# Patient Record
Sex: Male | Born: 1960 | Race: White | Hispanic: No | Marital: Married | Smoking: Former smoker
Health system: Southern US, Community
[De-identification: ages and names within clinical notes are randomized; demographics above are authoritative.]

## PROBLEM LIST (undated history)

## (undated) DIAGNOSIS — K219 Gastro-esophageal reflux disease without esophagitis: Secondary | ICD-10-CM

## (undated) HISTORY — PX: NASAL SEPTUM SURGERY: SHX37

---

## 2013-03-05 ENCOUNTER — Encounter (HOSPITAL_COMMUNITY): Payer: Self-pay | Admitting: Pharmacy Technician

## 2013-03-05 ENCOUNTER — Other Ambulatory Visit (HOSPITAL_COMMUNITY): Payer: Self-pay | Admitting: Orthopedic Surgery

## 2013-03-05 NOTE — Patient Instructions (Signed)
20 Jeremiah Banks  03/05/2013   Your procedure is scheduled on:  03/07/13  THURSDAY  Report to [facility] at      1:30 PM  Call this number if you have problems the morning of surgery: [phone]       Remember:   Do not eat food:After Midnight. TONIGHT--- MAY HAVE CLEAR LIQUIDS Thursday MORNING UNTIL 0930am-  THEN NOTHING BY MOUTH   Take these medicines the morning of surgery with A SIP OF WATER:  May take hydromorphone if needed   .  Contacts, dentures or partial plates can not be worn to surgery  Leave suitcase in the car. After surgery it may be brought to your room.  For patients admitted to the hospital, checkout time is 11:00 AM day of  discharge.             SPECIAL INSTRUCTIONS- SEE [place] PREPARING FOR SURGERY INSTRUCTION SHEET-     DO NOT WEAR JEWELRY, LOTIONS, POWDERS, OR PERFUMES.  WOMEN-- DO NOT SHAVE LEGS OR UNDERARMS FOR 12 HOURS BEFORE SHOWERS. MEN MAY SHAVE FACE.  Patients discharged the day of surgery will not be allowed to drive home. IF going home the day of surgery, you must have a driver and someone to stay with you for the first 24 hours  Name and phone number of your driver:    Overnight stay                                                                    Please read over the following fact sheets that you were given: MRSA Information, Incentive Spirometry Sheet                                                                                  Jeremiah Banks  PST 336  [phone]                 FAILURE TO FOLLOW THESE INSTRUCTIONS MAY RESULT IN  CANCELLATION   OF YOUR SURGERY                                                  Patient Signature _____________________________

## 2013-03-05 NOTE — H&P (Signed)
Jeremiah Banks is an 52 y.o. male.   Chief Complaint: low back pain HPI: Jeremiah Banks presented to Dr. Grant Banks office with the chief complaint of low back pain. He has a two month history of pain in his back radiating into the right lower limb. He did have significantly more pain over the month. He was seen at [facility] and was given oral prednisone. It worked very well for the pain while he was on it, but the pain just came back. He states he has had issues like this in the past, but certainly not as bad as this. They usually get better on its own. He has never had back surgery. He really denies any significant weakness. He denies any bowel or bladder incontinence. No fevers, chills or night sweats. No recent trauma. 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 He notes he can not stand for a long period of time. Symptoms are much worse in the morning. MRI showed lumbar disc herniation at L4-L5 on the right.   Allergies: No Known Allergies  Family History Diabetes Mellitus. Maternal Grandfather, Mother. First Degree Relatives. reported Heart Disease. Maternal Grandfather. Heart disease in male family member before age 38 Heart disease in male family member before age 101  Social History Children. 1 Current work status. working full time Exercise. Exercises daily; does running / walking and gym / weights Living situation. live with spouse Marital status. married Never consumed alcohol. Never consumed alcohol No history of drug/alcohol rehab Not under pain contract Tobacco use. Former smoker.  smoke(d) 1 pack(s) per day  Past Surgical History Straighten Nasal Septum  Past Medical History Gastroesophageal Reflux Disease  Current outpatient prescriptions: HYDROmorphone (DILAUDID) 4 MG tablet, Take 4 mg by mouth every 4 (four) hours as needed for moderate pain or severe pain., Disp: , Rfl:   Review of Systems  Constitutional: Negative.   HENT: Negative.   Eyes:  Negative.   Respiratory: Negative.   Cardiovascular: Negative.   Gastrointestinal: Negative.   Genitourinary: Negative.   Musculoskeletal: Positive for back pain. Negative for falls, joint pain, myalgias and neck pain.  Skin: Negative.   Neurological: Negative.   Endo/Heme/Allergies: Negative.   Psychiatric/Behavioral: Negative.    Vitals Weight: 170 lb Height: 69.5 in Body Surface Area: 1.94 m Body Mass Index: 24.74 kg/m Pain Level: 9/10 BP: 123/74 (Sitting, Left Arm, Standard)  Physical Exam  Constitutional: He is oriented to person, place, and time. He appears well-developed and well-nourished. No distress.  HENT:  Head: Normocephalic and atraumatic.  Right Ear: External ear normal.  Left Ear: External ear normal.  Nose: Nose normal.  Mouth/Throat: Oropharynx is clear and moist.  Eyes: Conjunctivae and EOM are normal.  Neck: Normal range of motion. Neck supple.  Cardiovascular: Normal rate, regular rhythm, normal heart sounds and intact distal pulses.   No murmur heard. Respiratory: Effort normal and breath sounds normal. No respiratory distress. He has no wheezes.  GI: Soft. Bowel sounds are normal. He exhibits no distension. There is no tenderness.  Musculoskeletal:  He does have a positive straight leg raise on the right at about 30 degrees. This causes recurrent pain in the back, buttock and thigh. Negative cross straight leg raise on the left. He has excellent reflexes at the knees as well as the ankles. He may have a little bit of weakness of his right EHL muscle. He is able to walk on his heels without an obvious foot drop or foot slap.  Neurological:  He is alert and oriented to person, place, and time. He has normal strength and normal reflexes. No sensory deficit.  Skin: No rash noted. He is not diaphoretic. No erythema.  Psychiatric: He has a normal mood and affect. His behavior is normal.     Assessment/Plan Lumbar disc herniation L4-L5 right He  needs a lumbar hemilaminectomy and microdiscectomy L4-L5 on the right.  The possible complications of spinal surgery number one could be infection, which is extremely rare. We do use antibiotics prior to the surgery and during surgery and after surgery. Number two is always a slight degree of probability that you could develop a blood clot in your leg after any type of surgery and we try our best to prevent that with aspirin post op when it is safe to begin. The third is a dural leak. That is the spinal fluid leak that could occur. At certain rare times the bone or the disc could literally stick to the dura which is the lining which contains the spinal fluid and we could develop a small tear in that lining which we then patch up. That is an extremely rare complication. The last and final complication is a recurrent disc rupture. That means that you could rupture another small piece of disc later on down the road and there is about a 2% chance of that.  Aeden Matranga LAUREN 03/05/2013, 3:24 PM

## 2013-03-06 ENCOUNTER — Encounter (HOSPITAL_COMMUNITY)
Admission: RE | Admit: 2013-03-06 | Discharge: 2013-03-06 | Disposition: A | Discharge status: Home / Self Care | Payer: BC Managed Care – PPO | Source: Ambulatory Visit | Attending: Orthopedic Surgery | Admitting: Orthopedic Surgery

## 2013-03-06 ENCOUNTER — Ambulatory Visit (HOSPITAL_COMMUNITY)
Admission: RE | Admit: 2013-03-06 | Discharge: 2013-03-06 | Disposition: A | Discharge status: Home / Self Care | Payer: BC Managed Care – PPO | Source: Ambulatory Visit | Attending: Surgical | Admitting: Surgical

## 2013-03-06 ENCOUNTER — Encounter (HOSPITAL_COMMUNITY): Payer: Self-pay

## 2013-03-06 DIAGNOSIS — M5137 Other intervertebral disc degeneration, lumbosacral region: Secondary | ICD-10-CM | POA: Insufficient documentation

## 2013-03-06 DIAGNOSIS — Z01810 Encounter for preprocedural cardiovascular examination: Secondary | ICD-10-CM | POA: Insufficient documentation

## 2013-03-06 DIAGNOSIS — Z01812 Encounter for preprocedural laboratory examination: Secondary | ICD-10-CM | POA: Insufficient documentation

## 2013-03-06 DIAGNOSIS — M51379 Other intervertebral disc degeneration, lumbosacral region without mention of lumbar back pain or lower extremity pain: Secondary | ICD-10-CM | POA: Insufficient documentation

## 2013-03-06 DIAGNOSIS — Z01818 Encounter for other preprocedural examination: Secondary | ICD-10-CM | POA: Insufficient documentation

## 2013-03-06 HISTORY — DX: Gastro-esophageal reflux disease without esophagitis: K21.9

## 2013-03-06 LAB — URINALYSIS, ROUTINE W REFLEX MICROSCOPIC
Bilirubin Urine: NEGATIVE
Glucose, UA: NEGATIVE mg/dL
Hgb urine dipstick: NEGATIVE
Ketones, ur: NEGATIVE mg/dL
Leukocytes, UA: NEGATIVE
Nitrite: NEGATIVE
Protein, ur: NEGATIVE mg/dL
Specific Gravity, Urine: 1.025 (ref 1.005–1.030)
Urobilinogen, UA: 0.2 mg/dL (ref 0.0–1.0)
pH: 5.5 (ref 5.0–8.0)

## 2013-03-06 LAB — COMPREHENSIVE METABOLIC PANEL
ALT: 52 U/L (ref 0–53)
AST: 24 U/L (ref 0–37)
Albumin: 4.2 g/dL (ref 3.5–5.2)
Alkaline Phosphatase: 64 U/L (ref 39–117)
BUN: 19 mg/dL (ref 6–23)
CO2: 28 mEq/L (ref 19–32)
Calcium: 9.8 mg/dL (ref 8.4–10.5)
Chloride: 106 mEq/L (ref 96–112)
Creatinine, Ser: 0.95 mg/dL (ref 0.50–1.35)
GFR calc Af Amer: >90 mL/min (ref >90)
GFR calc non Af Amer: >90 mL/min (ref >90)
Glucose, Bld: 99 mg/dL (ref 70–99)
Potassium: 4.3 mEq/L (ref 3.5–5.1)
Sodium: 142 mEq/L (ref 135–145)
Total Bilirubin: 0.6 mg/dL (ref 0.3–1.2)
Total Protein: 7 g/dL (ref 6.0–8.3)

## 2013-03-06 LAB — CBC
HCT: 38.7 % — ABNORMAL LOW (ref 39.0–52.0)
Hemoglobin: 13.5 g/dL (ref 13.0–17.0)
MCH: 30.5 pg (ref 26.0–34.0)
MCV: 87.6 fL (ref 78.0–100.0)
RBC: 4.42 MIL/uL (ref 4.22–5.81)

## 2013-03-06 LAB — APTT: aPTT: 29 seconds (ref 24–37)

## 2013-03-06 LAB — PROTIME-INR
INR: 1.03 (ref 0.00–1.49)
Prothrombin Time: 13.3 seconds (ref 11.6–15.2)

## 2013-03-06 NOTE — Progress Notes (Signed)
PATIENT STATES COMPLETED PREDNISONE DOSE PACK GIVEN TO HIM BY DR GIOFFRE TODAY

## 2013-03-07 ENCOUNTER — Encounter (HOSPITAL_COMMUNITY): Payer: BC Managed Care – PPO | Admitting: Anesthesiology

## 2013-03-07 ENCOUNTER — Ambulatory Visit (HOSPITAL_COMMUNITY): Payer: BC Managed Care – PPO | Admitting: Anesthesiology

## 2013-03-07 ENCOUNTER — Ambulatory Visit (HOSPITAL_COMMUNITY): Payer: BC Managed Care – PPO

## 2013-03-07 ENCOUNTER — Encounter (HOSPITAL_COMMUNITY): Payer: Self-pay | Admitting: Anesthesiology

## 2013-03-07 ENCOUNTER — Observation Stay (HOSPITAL_COMMUNITY)
Admission: RE | Admit: 2013-03-07 | Discharge: 2013-03-09 | Disposition: A | Discharge status: Home / Self Care | Payer: BC Managed Care – PPO | Source: Ambulatory Visit | Attending: Orthopedic Surgery | Admitting: Orthopedic Surgery

## 2013-03-07 ENCOUNTER — Encounter (HOSPITAL_COMMUNITY)
Admission: RE | Disposition: A | Discharge status: Home / Self Care | Payer: Self-pay | Source: Ambulatory Visit | Attending: Orthopedic Surgery

## 2013-03-07 DIAGNOSIS — M5126 Other intervertebral disc displacement, lumbar region: Principal | ICD-10-CM | POA: Diagnosis present

## 2013-03-07 DIAGNOSIS — M48062 Spinal stenosis, lumbar region with neurogenic claudication: Secondary | ICD-10-CM | POA: Diagnosis present

## 2013-03-07 DIAGNOSIS — Z01818 Encounter for other preprocedural examination: Secondary | ICD-10-CM | POA: Insufficient documentation

## 2013-03-07 DIAGNOSIS — Z01812 Encounter for preprocedural laboratory examination: Secondary | ICD-10-CM | POA: Insufficient documentation

## 2013-03-07 DIAGNOSIS — Z87891 Personal history of nicotine dependence: Secondary | ICD-10-CM | POA: Insufficient documentation

## 2013-03-07 DIAGNOSIS — K219 Gastro-esophageal reflux disease without esophagitis: Secondary | ICD-10-CM | POA: Insufficient documentation

## 2013-03-07 DIAGNOSIS — Z01810 Encounter for preprocedural cardiovascular examination: Secondary | ICD-10-CM | POA: Insufficient documentation

## 2013-03-07 HISTORY — PX: HEMI-MICRODISCECTOMY LUMBAR LAMINECTOMY LEVEL 1: SHX5846

## 2013-03-07 LAB — GLUCOSE, CAPILLARY: Glucose-Capillary: 86 mg/dL (ref 70–99)

## 2013-03-07 SURGERY — HEMI-MICRODISCECTOMY LUMBAR LAMINECTOMY LEVEL 1
Anesthesia: General | Site: Back | Laterality: Right

## 2013-03-07 MED ORDER — HYDROMORPHONE HCL PF 1 MG/ML IJ SOLN
INTRAMUSCULAR | Status: AC
  Filled 2013-03-07: qty 1

## 2013-03-07 MED ORDER — HYDROCODONE-ACETAMINOPHEN 5-325 MG PO TABS
1.0000 | ORAL_TABLET | ORAL | Status: DC | PRN
  Administered 2013-03-07 – 2013-03-08 (×3): 2 via ORAL
  Filled 2013-03-07 (×3): qty 2

## 2013-03-07 MED ORDER — ROCURONIUM BROMIDE 100 MG/10ML IV SOLN
INTRAVENOUS | Status: AC
  Filled 2013-03-07: qty 1

## 2013-03-07 MED ORDER — LACTATED RINGERS IV SOLN
INTRAVENOUS | Status: DC
  Administered 2013-03-07: 1000 mL via INTRAVENOUS

## 2013-03-07 MED ORDER — BUPIVACAINE-EPINEPHRINE PF 0.5-1:200000 % IJ SOLN
INTRAMUSCULAR | Status: AC
  Filled 2013-03-07: qty 30

## 2013-03-07 MED ORDER — NEOSTIGMINE METHYLSULFATE 1 MG/ML IJ SOLN
INTRAMUSCULAR | Status: AC
  Filled 2013-03-07: qty 10

## 2013-03-07 MED ORDER — FENTANYL CITRATE 0.05 MG/ML IJ SOLN
INTRAMUSCULAR | Status: DC | PRN
  Administered 2013-03-07 (×3): 50 ug via INTRAVENOUS

## 2013-03-07 MED ORDER — HYDROMORPHONE HCL PF 1 MG/ML IJ SOLN
0.2500 mg | INTRAMUSCULAR | Status: DC | PRN
  Administered 2013-03-07: 0.5 mg via INTRAVENOUS
  Administered 2013-03-07 (×2): 0.25 mg via INTRAVENOUS
  Administered 2013-03-07: 0.5 mg via INTRAVENOUS

## 2013-03-07 MED ORDER — NEOSTIGMINE METHYLSULFATE 1 MG/ML IJ SOLN
INTRAMUSCULAR | Status: DC | PRN
  Administered 2013-03-07: 4 mg via INTRAVENOUS

## 2013-03-07 MED ORDER — MIDAZOLAM HCL 5 MG/5ML IJ SOLN
INTRAMUSCULAR | Status: DC | PRN
  Administered 2013-03-07: 2 mg via INTRAVENOUS

## 2013-03-07 MED ORDER — MIDAZOLAM HCL 2 MG/2ML IJ SOLN
INTRAMUSCULAR | Status: AC
  Filled 2013-03-07: qty 2

## 2013-03-07 MED ORDER — THROMBIN 5000 UNITS EX SOLR
CUTANEOUS | Status: DC | PRN
  Administered 2013-03-07: 10000 [IU] via TOPICAL

## 2013-03-07 MED ORDER — LACTATED RINGERS IV SOLN
INTRAVENOUS | Status: DC | PRN
  Administered 2013-03-07 (×2): via INTRAVENOUS

## 2013-03-07 MED ORDER — BUPIVACAINE-EPINEPHRINE PF 0.5-1:200000 % IJ SOLN
INTRAMUSCULAR | Status: DC | PRN
  Administered 2013-03-07: 15 mL via PERINEURAL

## 2013-03-07 MED ORDER — PHENYLEPHRINE HCL 10 MG/ML IJ SOLN
INTRAMUSCULAR | Status: AC
  Filled 2013-03-07: qty 1

## 2013-03-07 MED ORDER — PROMETHAZINE HCL 25 MG/ML IJ SOLN: 6.2500 mg | INTRAMUSCULAR | Status: DC | PRN

## 2013-03-07 MED ORDER — ONDANSETRON HCL 4 MG/2ML IJ SOLN
INTRAMUSCULAR | Status: AC
  Filled 2013-03-07: qty 2

## 2013-03-07 MED ORDER — CEFAZOLIN SODIUM-DEXTROSE 2-3 GM-% IV SOLR
INTRAVENOUS | Status: AC
  Filled 2013-03-07: qty 50

## 2013-03-07 MED ORDER — HYDROMORPHONE HCL PF 1 MG/ML IJ SOLN
0.5000 mg | INTRAMUSCULAR | Status: DC | PRN
  Administered 2013-03-07 – 2013-03-08 (×4): 1 mg via INTRAVENOUS
  Filled 2013-03-07 (×4): qty 1

## 2013-03-07 MED ORDER — LIDOCAINE HCL (CARDIAC) 20 MG/ML IV SOLN
INTRAVENOUS | Status: AC
  Filled 2013-03-07: qty 5

## 2013-03-07 MED ORDER — LIDOCAINE HCL (CARDIAC) 20 MG/ML IV SOLN
INTRAVENOUS | Status: DC | PRN
  Administered 2013-03-07: 70 mg via INTRAVENOUS

## 2013-03-07 MED ORDER — BACITRACIN-NEOMYCIN-POLYMYXIN 400-5-5000 EX OINT
TOPICAL_OINTMENT | CUTANEOUS | Status: AC
  Filled 2013-03-07: qty 1

## 2013-03-07 MED ORDER — SODIUM CHLORIDE 0.9 % IR SOLN
Status: DC | PRN
  Administered 2013-03-07: 16:00:00

## 2013-03-07 MED ORDER — BACITRACIN-NEOMYCIN-POLYMYXIN 400-5-5000 EX OINT
TOPICAL_OINTMENT | CUTANEOUS | Status: DC | PRN
  Administered 2013-03-07: 1 via TOPICAL

## 2013-03-07 MED ORDER — MEPERIDINE HCL 50 MG/ML IJ SOLN
INTRAMUSCULAR | Status: AC
  Filled 2013-03-07: qty 1

## 2013-03-07 MED ORDER — BUPIVACAINE LIPOSOME 1.3 % IJ SUSP
20.0000 mL | Freq: Once | INTRAMUSCULAR | Status: AC
  Administered 2013-03-07: 20 mL
  Filled 2013-03-07: qty 20

## 2013-03-07 MED ORDER — ACETAMINOPHEN 650 MG RE SUPP: 650.0000 mg | RECTAL | Status: DC | PRN

## 2013-03-07 MED ORDER — PROPOFOL 10 MG/ML IV BOLUS
INTRAVENOUS | Status: AC
  Filled 2013-03-07: qty 20

## 2013-03-07 MED ORDER — MEPERIDINE HCL 50 MG/ML IJ SOLN
12.5000 mg | Freq: Once | INTRAMUSCULAR | Status: AC
Start: 2013-03-07 — End: 2013-03-07
  Administered 2013-03-07: 12.5 mg via INTRAVENOUS

## 2013-03-07 MED ORDER — CEFAZOLIN SODIUM-DEXTROSE 2-3 GM-% IV SOLR
2.0000 g | INTRAVENOUS | Status: AC
Start: 2013-03-08 — End: 2013-03-07
  Administered 2013-03-07: 2 g via INTRAVENOUS

## 2013-03-07 MED ORDER — PROPOFOL 10 MG/ML IV BOLUS
INTRAVENOUS | Status: DC | PRN
  Administered 2013-03-07: 150 mg via INTRAVENOUS

## 2013-03-07 MED ORDER — FLEET ENEMA 7-19 GM/118ML RE ENEM: 1.0000 | ENEMA | Freq: Once | RECTAL | Status: AC | PRN

## 2013-03-07 MED ORDER — LACTATED RINGERS IV SOLN
INTRAVENOUS | Status: DC
Start: 2013-03-07 — End: 2013-03-09
  Administered 2013-03-08 – 2013-03-09 (×2): via INTRAVENOUS

## 2013-03-07 MED ORDER — OXYCODONE-ACETAMINOPHEN 5-325 MG PO TABS
1.0000 | ORAL_TABLET | ORAL | Status: DC | PRN
  Administered 2013-03-08 – 2013-03-09 (×2): 2 via ORAL
  Filled 2013-03-07 (×2): qty 2

## 2013-03-07 MED ORDER — GLYCOPYRROLATE 0.2 MG/ML IJ SOLN
INTRAMUSCULAR | Status: AC
  Filled 2013-03-07: qty 3

## 2013-03-07 MED ORDER — FENTANYL CITRATE 0.05 MG/ML IJ SOLN
INTRAMUSCULAR | Status: AC
  Filled 2013-03-07: qty 5

## 2013-03-07 MED ORDER — METHOCARBAMOL 500 MG PO TABS: 500.0000 mg | ORAL_TABLET | Freq: Four times a day (QID) | ORAL | Status: DC | PRN

## 2013-03-07 MED ORDER — ROCURONIUM BROMIDE 100 MG/10ML IV SOLN
INTRAVENOUS | Status: DC | PRN
  Administered 2013-03-07 (×2): 10 mg via INTRAVENOUS
  Administered 2013-03-07: 50 mg via INTRAVENOUS
  Administered 2013-03-07: 10 mg via INTRAVENOUS

## 2013-03-07 MED ORDER — ACETAMINOPHEN 325 MG PO TABS: 650.0000 mg | ORAL_TABLET | ORAL | Status: DC | PRN

## 2013-03-07 MED ORDER — METHOCARBAMOL 100 MG/ML IJ SOLN
500.0000 mg | Freq: Four times a day (QID) | INTRAVENOUS | Status: DC | PRN
  Administered 2013-03-07 – 2013-03-08 (×2): 500 mg via INTRAVENOUS
  Filled 2013-03-07 (×2): qty 5

## 2013-03-07 MED ORDER — PHENOL 1.4 % MT LIQD: 1.0000 | OROMUCOSAL | Status: DC | PRN

## 2013-03-07 MED ORDER — THROMBIN 5000 UNITS EX SOLR
CUTANEOUS | Status: AC
  Filled 2013-03-07: qty 10000

## 2013-03-07 MED ORDER — POLYETHYLENE GLYCOL 3350 17 G PO PACK: 17.0000 g | PACK | Freq: Every day | ORAL | Status: DC | PRN

## 2013-03-07 MED ORDER — PHENYLEPHRINE HCL 10 MG/ML IJ SOLN
INTRAMUSCULAR | Status: DC | PRN
  Administered 2013-03-07 (×5): 40 ug via INTRAVENOUS

## 2013-03-07 MED ORDER — CEFAZOLIN SODIUM 1-5 GM-% IV SOLN
1.0000 g | Freq: Three times a day (TID) | INTRAVENOUS | Status: AC
  Administered 2013-03-07 – 2013-03-08 (×3): 1 g via INTRAVENOUS
  Filled 2013-03-07 (×3): qty 50

## 2013-03-07 MED ORDER — BISACODYL 10 MG RE SUPP
10.0000 mg | Freq: Every day | RECTAL | Status: DC | PRN
Start: 2013-03-07 — End: 2013-03-09

## 2013-03-07 MED ORDER — GLYCOPYRROLATE 0.2 MG/ML IJ SOLN
INTRAMUSCULAR | Status: DC | PRN
  Administered 2013-03-07: .6 mg via INTRAVENOUS

## 2013-03-07 MED ORDER — ONDANSETRON HCL 4 MG/2ML IJ SOLN
4.0000 mg | INTRAMUSCULAR | Status: DC | PRN
  Administered 2013-03-08 (×2): 4 mg via INTRAVENOUS
  Filled 2013-03-07 (×2): qty 2

## 2013-03-07 MED ORDER — ONDANSETRON HCL 4 MG/2ML IJ SOLN
INTRAMUSCULAR | Status: DC | PRN
  Administered 2013-03-07: 4 mg via INTRAVENOUS

## 2013-03-07 MED ORDER — MENTHOL 3 MG MT LOZG
1.0000 | LOZENGE | OROMUCOSAL | Status: DC | PRN
  Filled 2013-03-07: qty 9

## 2013-03-07 SURGICAL SUPPLY — 39 items
BAG ZIPLOCK 12X15 (MISCELLANEOUS) ×2 IMPLANT
BENZOIN TINCTURE PRP APPL 2/3 (GAUZE/BANDAGES/DRESSINGS) ×2 IMPLANT
CLEANER TIP ELECTROSURG 2X2 (MISCELLANEOUS) ×2 IMPLANT
CONT SPECI 4OZ STER CLIK (MISCELLANEOUS) ×2 IMPLANT
DRAIN PENROSE 18X1/4 LTX STRL (WOUND CARE) IMPLANT
DRAPE MICROSCOPE LEICA (MISCELLANEOUS) ×2 IMPLANT
DRAPE POUCH INSTRU U-SHP 10X18 (DRAPES) ×2 IMPLANT
DRAPE SURG 17X11 SM STRL (DRAPES) ×2 IMPLANT
DRSG ADAPTIC 3X8 NADH LF (GAUZE/BANDAGES/DRESSINGS) ×2 IMPLANT
DURAPREP 26ML APPLICATOR (WOUND CARE) ×2 IMPLANT
ELECT REM PT RETURN 9FT ADLT (ELECTROSURGICAL) ×2
ELECTRODE REM PT RTRN 9FT ADLT (ELECTROSURGICAL) ×1 IMPLANT
GLOVE BIOGEL PI IND STRL 8 (GLOVE) ×1 IMPLANT
GLOVE BIOGEL PI INDICATOR 8 (GLOVE) ×1
GLOVE ECLIPSE 8.0 STRL XLNG CF (GLOVE) ×8 IMPLANT
GOWN PREVENTION PLUS LG XLONG (DISPOSABLE) ×6 IMPLANT
GOWN STRL REIN XL XLG (GOWN DISPOSABLE) ×8 IMPLANT
KIT BASIN OR (CUSTOM PROCEDURE TRAY) ×2 IMPLANT
KIT POSITIONING SURG ANDREWS (MISCELLANEOUS) ×2 IMPLANT
MANIFOLD NEPTUNE II (INSTRUMENTS) ×2 IMPLANT
NEEDLE SPNL 18GX3.5 QUINCKE PK (NEEDLE) ×4 IMPLANT
NS IRRIG 1000ML POUR BTL (IV SOLUTION) ×2 IMPLANT
PAD ABD 8X10 STRL (GAUZE/BANDAGES/DRESSINGS) ×2 IMPLANT
PATTIES SURGICAL .5 X.5 (GAUZE/BANDAGES/DRESSINGS) IMPLANT
PATTIES SURGICAL .75X.75 (GAUZE/BANDAGES/DRESSINGS) IMPLANT
PATTIES SURGICAL 1X1 (DISPOSABLE) IMPLANT
PIN SAFETY NICK PLATE  2 MED (MISCELLANEOUS)
PIN SAFETY NICK PLATE 2 MED (MISCELLANEOUS) IMPLANT
POSITIONER SURGICAL ARM (MISCELLANEOUS) IMPLANT
SPONGE LAP 4X18 X RAY DECT (DISPOSABLE) ×2 IMPLANT
SPONGE SURGIFOAM ABS GEL 100 (HEMOSTASIS) ×2 IMPLANT
STAPLER VISISTAT 35W (STAPLE) IMPLANT
SUT VIC AB 0 CT1 27 (SUTURE) ×1
SUT VIC AB 0 CT1 27XBRD ANTBC (SUTURE) ×1 IMPLANT
SUT VIC AB 1 CT1 27 (SUTURE) ×5
SUT VIC AB 1 CT1 27XBRD ANTBC (SUTURE) ×5 IMPLANT
TOWEL OR 17X26 10 PK STRL BLUE (TOWEL DISPOSABLE) ×4 IMPLANT
TRAY LAMINECTOMY (CUSTOM PROCEDURE TRAY) ×2 IMPLANT
WATER STERILE IRR 1500ML POUR (IV SOLUTION) IMPLANT

## 2013-03-07 NOTE — Transfer of Care (Signed)
Immediate Anesthesia Transfer of Care Note  Patient: Jeremiah Banks  Procedure(s) Performed: Procedure(s): HEMI-MICRODISCECTOMY LUMBAR LAMINECTOMY L4-L5 ON RIGHT (Right)  Patient Location: PACU  Anesthesia Type:General  Level of Consciousness: Patient easily awoken, sedated, comfortable, cooperative, following commands, responds to stimulation.   Airway & Oxygen Therapy: Patient spontaneously breathing, ventilating well, oxygen via simple oxygen mask.  Post-op Assessment: Report given to PACU RN, vital signs reviewed and stable, moving all extremities.   Post vital signs: Reviewed and stable.  Complications: No apparent anesthesia complications

## 2013-03-07 NOTE — Preoperative (Signed)
Beta Blockers   Reason not to administer Beta Blockers:Not Applicable 

## 2013-03-07 NOTE — Anesthesia Postprocedure Evaluation (Signed)
  Anesthesia Post-op Note  Patient: Jeremiah Banks  Procedure(s) Performed: Procedure(s) (LRB): HEMI-MICRODISCECTOMY LUMBAR LAMINECTOMY L4-L5 ON RIGHT (Right)  Patient Location: PACU  Anesthesia Type: General  Level of Consciousness: awake and alert   Airway and Oxygen Therapy: Patient Spontanous Breathing  Post-op Pain: mild  Post-op Assessment: Post-op Vital signs reviewed, Patient's Cardiovascular Status Stable, Respiratory Function Stable, Patent Airway and No signs of Nausea or vomiting  Last Vitals:  Filed Vitals:   03/07/13 1915  BP: 113/70  Pulse:   Temp: 36.5 C  Resp:     Post-op Vital Signs: stable   Complications: No apparent anesthesia complications

## 2013-03-07 NOTE — Interval H&P Note (Signed)
History and Physical Interval Note:  03/07/2013 3:29 PM  Jeremiah Banks  has presented today for surgery, with the diagnosis of HERNIATED DISC  The various methods of treatment have been discussed with the patient and family. After consideration of risks, benefits and other options for treatment, the patient has consented to  Procedure(s): HEMI-MICRODISCECTOMY LUMBAR LAMINECTOMY L4-L5 ON RIGHT (Right) as a surgical intervention .  The patient's history has been reviewed, patient examined, no change in status, stable for surgery.  I have reviewed the patient's chart and labs.  Questions were answered to the patient's satisfaction.     Ajna Moors A

## 2013-03-07 NOTE — Brief Op Note (Signed)
03/07/2013  6:08 PM  PATIENT:  Jeremiah Banks  52 y.o. male  PRE-OPERATIVE DIAGNOSIS:  HERNIATED DISC,Large and Spinal Stenosis  POST-OPERATIVE DIAGNOSIS:  HERNIATED DISC,Large and Spinal Stenosis  PROCEDURE:  Procedure(s): HEMI-MICRODISCECTOMY LUMBAR LAMINECTOMY L4-L5 ON RIGHT (Right) and Decompression of Lateral Recess for Recess Stenosis  SURGEON:  Surgeon(s) and Role:    * Jacki Cones, MD - Primary    * Drucilla Schmidt, MD - Assisting     ASSISTANTS: Marlowe Kays MD  ANESTHESIA:   general  EBL:  Total I/O In: 1000 [I.V.:1000] Out: 50 [Blood:50]  BLOOD ADMINISTERED:none  DRAINS: none   LOCAL MEDICATIONS USED:  MARCAINE 20cc of 0.50% with Epinephrine at The start of the case and 20cc of Exparel at the end of the case.    SPECIMEN:  Source of Specimen:  L-4-L-5 Disc Space  DISPOSITION OF SPECIMEN:  PATHOLOGY  COUNTS:  YES  TOURNIQUET:  * No tourniquets in log *  DICTATION: .Other Dictation: Dictation Number [id number]  PLAN OF CARE: Admit for overnight observation  PATIENT DISPOSITION:  PACU - hemodynamically stable.   Delay start of Pharmacological VTE agent (>24hrs) due to surgical blood loss or risk of bleeding: yes

## 2013-03-07 NOTE — Anesthesia Preprocedure Evaluation (Addendum)
Anesthesia Evaluation  Patient identified by MRN, date of birth, ID band Patient awake    Reviewed: Allergy & Precautions, H&P , NPO status , Patient's Chart, lab work & pertinent test results  Airway Mallampati: II TM Distance: >3 FB Neck ROM: Full    Dental no notable dental hx.    Pulmonary former smoker,  breath sounds clear to auscultation  Pulmonary exam normal       Cardiovascular negative cardio ROS  Rhythm:Regular Rate:Normal     Neuro/Psych negative neurological ROS  negative psych ROS   GI/Hepatic Neg liver ROS, GERD-  ,  Endo/Other  negative endocrine ROS  Renal/GU negative Renal ROS  negative genitourinary   Musculoskeletal negative musculoskeletal ROS (+)   Abdominal   Peds negative pediatric ROS (+)  Hematology negative hematology ROS (+)   Anesthesia Other Findings   Reproductive/Obstetrics negative OB ROS                          Anesthesia Physical Anesthesia Plan  ASA: II  Anesthesia Plan: General   Post-op Pain Management:    Induction: Intravenous  Airway Management Planned: Oral ETT  Additional Equipment:   Intra-op Plan:   Post-operative Plan: Extubation in OR  Informed Consent: I have reviewed the patients History and Physical, chart, labs and discussed the procedure including the risks, benefits and alternatives for the proposed anesthesia with the patient or authorized representative who has indicated his/her understanding and acceptance.   Dental advisory given  Plan Discussed with: CRNA  Anesthesia Plan Comments:         Anesthesia Quick Evaluation

## 2013-03-08 ENCOUNTER — Encounter (HOSPITAL_COMMUNITY): Payer: Self-pay | Admitting: Orthopedic Surgery

## 2013-03-08 MED ORDER — KETOROLAC TROMETHAMINE 30 MG/ML IJ SOLN
30.0000 mg | Freq: Four times a day (QID) | INTRAMUSCULAR | Status: DC | PRN
  Administered 2013-03-08 – 2013-03-09 (×2): 30 mg via INTRAVENOUS
  Filled 2013-03-08 (×2): qty 1

## 2013-03-08 MED ORDER — PROMETHAZINE HCL 25 MG/ML IJ SOLN
12.5000 mg | Freq: Four times a day (QID) | INTRAMUSCULAR | Status: DC | PRN
  Administered 2013-03-08 (×2): 12.5 mg via INTRAVENOUS
  Filled 2013-03-08 (×2): qty 1

## 2013-03-08 MED ORDER — OXYCODONE-ACETAMINOPHEN 5-325 MG PO TABS: 1.0000 | ORAL_TABLET | ORAL | Status: AC | PRN

## 2013-03-08 MED ORDER — IBUPROFEN 800 MG PO TABS
800.0000 mg | ORAL_TABLET | Freq: Three times a day (TID) | ORAL | Status: DC | PRN
  Administered 2013-03-08: 800 mg via ORAL
  Filled 2013-03-08: qty 1

## 2013-03-08 MED ORDER — INFLUENZA VAC SPLIT QUAD 0.5 ML IM SUSP
0.5000 mL | Freq: Once | INTRAMUSCULAR | Status: AC
  Administered 2013-03-09: 0.5 mL via INTRAMUSCULAR
  Filled 2013-03-08 (×2): qty 0.5

## 2013-03-08 MED ORDER — IBUPROFEN 800 MG PO TABS
800.0000 mg | ORAL_TABLET | Freq: Three times a day (TID) | ORAL | Status: DC | PRN
  Filled 2013-03-08: qty 1

## 2013-03-08 MED ORDER — METHOCARBAMOL 500 MG PO TABS: 500.0000 mg | ORAL_TABLET | Freq: Four times a day (QID) | ORAL | Status: AC | PRN

## 2013-03-08 MED ORDER — IBUPROFEN 800 MG PO TABS: 800.0000 mg | ORAL_TABLET | Freq: Three times a day (TID) | ORAL | Status: AC | PRN

## 2013-03-08 NOTE — Progress Notes (Signed)
OT Cancellation Note  Patient Details Name: Jeremiah Banks MRN: [id number] DOB: [birth year]   Cancelled Treatment:    Reason Eval/Treat Not Completed: Pain limiting ability to participate.  Pt has bad headache:  Have checked twice this am.  Will check back later if pt is leaving.    Verla Bryngelson 03/08/2013, 10:35 AM Marica Otter, OTR/L [phone] 03/08/2013

## 2013-03-08 NOTE — Progress Notes (Signed)
Subjective: 1 Day Post-Op Procedure(s) (LRB): HEMI-MICRODISCECTOMY LUMBAR LAMINECTOMY L4-L5 ON RIGHT (Right) Patient reports pain as 2 on 0-10 scale.No lleg pain today. He is remarkably improved.    Objective: Vital signs in last 24 hours: Temp:  [97.3 F (36.3 C)-98.1 F (36.7 C)] 98.1 F (36.7 C) (12/19 0549) Pulse Rate:  [50-66] 58 (12/19 0549) Resp:  [9-18] 16 (12/19 0549) BP: (109-134)/(60-82) 116/70 mmHg (12/19 0549) SpO2:  [98 %-100 %] 100 % (12/19 0549) Weight:  [76.204 kg (168 lb)] 76.204 kg (168 lb) (12/18 1951)  Intake/Output from previous day: 12/18 0701 - 12/19 0700 In: 1750 [I.V.:1650; IV Piggyback:100] Out: 1625 [Urine:1575; Blood:50] Intake/Output this shift:     Recent Labs  03/06/13 1445  HGB 13.5    Recent Labs  03/06/13 1445  WBC 10.1  RBC 4.42  HCT 38.7*  PLT 271    Recent Labs  03/06/13 1445  NA 142  K 4.3  CL 106  CO2 28  BUN 19  CREATININE 0.95  GLUCOSE 99  CALCIUM 9.8    Recent Labs  03/06/13 1445  INR 1.03    Dorsiflexion/Plantar flexion intact Compartment soft  Assessment/Plan: 1 Day Post-Op Procedure(s) (LRB): HEMI-MICRODISCECTOMY LUMBAR LAMINECTOMY L4-L5 ON RIGHT (Right) Discharge home with home health  Charnele Semple A 03/08/2013, 7:41 AM

## 2013-03-08 NOTE — Plan of Care (Signed)
Problem: Phase I Progression Outcomes Goal: OOB as tolerated unless otherwise ordered Outcome: Not Progressing Pt has an excruciating headache.

## 2013-03-08 NOTE — Progress Notes (Signed)
PT Cancellation Note  Patient Details Name: Jeremiah Banks MRN: [id number] DOB: 1960-11-29   Cancelled Treatment:     PT attempted x 2 but deferred at request of nursing 2* pt headache and nausea.  Will follow.   Anaka Beazer 03/08/2013, 11:52 AM

## 2013-03-08 NOTE — Progress Notes (Signed)
OT Cancellation Note  Patient Details Name: German Manke MRN: [id number] DOB: [birth year]   Cancelled Treatment:    Reason Eval/Treat Not Completed: Other (comment) Per PT note, pt still headache and nausea. Will defer at this time.  Lennox Laity [phone] 03/08/2013, 12:27 PM

## 2013-03-08 NOTE — Op Note (Signed)
Jeremiah Banks, Jeremiah Banks                 ACCOUNT NO.:  [id number]  MEDICAL RECORD NO.:  [id number]  LOCATION:  1602                         FACILITY:  [facility]  PHYSICIAN:  Georges Lynch. Jacquelyn Antony, M.D.DATE OF BIRTH:  1960/05/04  DATE OF PROCEDURE:  03/07/2013 DATE OF DISCHARGE:                              OPERATIVE REPORT   SURGEON:  Georges Lynch. Darrelyn Hillock, MD  ASSISTANT:  Marlowe Kays, MD  PREOPERATIVE DIAGNOSES: 1. Severe lateral recess stenosis at L4-L5 on the right. 2. Large herniated lumbar disk at L4-L5 on the right with migration     caudalward.  POSTOPERATIVE DIAGNOSES: 1. Severe lateral recess stenosis at L4-L5 on the right. 2. Large herniated lumbar disk at L4-L5 on the right with migration     caudalward.  OPERATION: 1. Hemilaminectomy at L4-L5 on the right. 2. Decompression of the lateral recess at L4 on the right for lateral     recess stenosis. 3. Microdiskectomy for an EXTREMELY LARGE herniated disk at L4-L5 on     the right.  OPERATION PROCEDURE:  Under general anesthesia, the patient on spinal frame, routine orthopedic prepping and draping of the lower back was carried out.  The appropriate time-out was first carried out.  Also at this time, I marked the appropriate right side of the back in the holding area.  After the prep, 2 needles were placed in the back for localization purposes and x-ray was taken.  The patient also had 2 g of IV Ancef.  Following this, we localized the incision site.  The incision was made over L4-L5 region, bleeders were identified and cauterized. The muscle was __________ then took another x-ray with instruments in place.  I then went down, started my hemilaminectomy in usual fashion. Note, the space was extremely tight.  There was __________ effect at this area.  We had to go out wide as well as decompress the lateral recess.  We cauterized lateral recess veins and we did use the microscope.  At this particular time, we had the microscope and  then we gently removed the ligamentum flavum to identify the dura and the nerve root.  Note, the dura and root were extremely tight, so we first did a nice foraminotomy, freed the root up nicely, went up proximally, cauterized lateral recess veins, and at this point, gently retracted the dura and a needle was placed in the disk space for x-ray purposes.  Once we established the L4-L5 interspace, cruciate incision was made.  We then carried out our diskectomy.  I first went into the disk space, cleaned out most of the disk, but then went distally under the subligamentous area and there was an EXTREMELY LARGE fragment that we removed.  We then utilized the nerve hook and the Epstein curettes on multiple passes to make sure there were no other disk fragments present __________ laterally as well.  We went proximally and medially and laterally as well as distally.  The root now was extremely free.  The dura was free.  We thoroughly irrigated out the area, loosely applied some thrombin-soaked Gelfoam and closed the wound layers in usual fashion.  Note, at the beginning of the case, I injected 20  mL of 0.25% Marcaine with epinephrine into the wound site to control bleeding.  At the end of the case, I injected 20 mL of Exparel into the soft tissue structures. The soft tissue was closed with 0 Vicryl, the skin with metal staples and a sterile Neosporin dressing was applied.  The patient left the operating room in satisfactory condition.          ______________________________ Georges Lynch Darrelyn Hillock, M.D.     RAG/MEDQ  D:  03/07/2013  T:  03/08/2013  Job:  [id number]

## 2013-03-09 NOTE — Progress Notes (Signed)
Pt to d/c home. DME delivered to room prior to d/c. AVS reviewed and "My Chart" discussed with pt. Pt capable of verbalizing medications, dressing changes, signs and symptoms of infection, and follow-up appointments. Remains hemodynamically stable. No signs and symptoms of distress. Educated pt to return to ER in the case of SOB, dizziness, or chest pain.  

## 2013-03-09 NOTE — Evaluation (Addendum)
Physical Therapy Evaluation Patient Details Name: Jeremiah Banks MRN: [id number] DOB: 31-Jan-1961 Today's Date: 03/09/2013 Time: 1191-4782 PT Time Calculation (min): 19 min  PT Assessment / Plan / Recommendation History of Present Illness  pt admitted for L4-5 lami/hemi microdiscectomy; HA yesterday, eval deferred until today  Clinical Impression  One time eval completed, pt feeling much better today    PT Assessment  Patent does not need any further PT services    Follow Up Recommendations  No PT follow up    Does the patient have the potential to tolerate intense rehabilitation      Barriers to Discharge        Equipment Recommendations       Recommendations for Other Services     Frequency      Precautions / Restrictions Precautions Precautions: Back Precaution Comments: handout issued per OT; PT reviewed all back precautions and pt able toverbalize 3/3 and no lifiting after session Restrictions Weight Bearing Restrictions: No   Pertinent Vitals/Pain Min c/o pain, better after mobility     Mobility  Bed Mobility Bed Mobility: Rolling Left;Left Sidelying to Sit Rolling Left: 5: Supervision Left Sidelying to Sit: 5: Supervision Transfers Transfers: Sit to Stand;Stand to Sit Sit to Stand: 5: Supervision;6: Modified independent (Device/Increase time) Stand to Sit: 5: Supervision;6: Modified independent (Device/Increase time) Details for Transfer Assistance: incr time intial cuesfor hand placement Ambulation/Gait Ambulation/Gait Assistance: 5: Supervision;6: Modified independent (Device/Increase time) Ambulation Distance (Feet): 300 Feet (and 100 more) Assistive device: None;1 person hand held assist Ambulation/Gait Assistance Details: cues for corect use of cane; pt with more equal stance time when using cane Gait Pattern: Antalgic;Decreased stance time - right;Decreased trunk rotation  Pt up/down 4 steps with  Rail and min/guard assist, wife present  Exercises      PT Diagnosis:    PT Problem List:   PT Treatment Interventions:       PT Goals(Current goals can be found in the care plan section) Acute Rehab PT Goals Patient Stated Goal: home PT Goal Formulation: No goals set, d/c therapy  Visit Information  Last PT Received On: 03/09/13 Assistance Needed: +1 History of Present Illness: pt admitted for L4-5 lami/hemi microdiscectomy; HA yesterday, eval deferred until today       Prior Functioning  Home Living Family/patient expects to be discharged to:: Private residence Living Arrangements: Spouse/significant other Available Help at Discharge: Family Type of Home: House Home Access: Level entry Home Layout: Two level;Bed/bath upstairs Alternate Level Stairs-Number of Steps: flight Prior Function Level of Independence: Independent Communication Communication: No difficulties Dominant Hand: Right    Cognition  Cognition Arousal/Alertness: Awake/alert Behavior During Therapy: WFL for tasks assessed/performed Overall Cognitive Status: Within Functional Limits for tasks assessed    Extremity/Trunk Assessment Upper Extremity Assessment Upper Extremity Assessment: Defer to OT evaluation Lower Extremity Assessment Lower Extremity Assessment: Overall WFL for tasks assessed   Balance    End of Session PT - End of Session Activity Tolerance: Patient tolerated treatment well Patient left: in chair;with call bell/phone within reach;with family/visitor present Nurse Communication: Mobility status  GP Functional Assessment Tool Used: clinicla judgement Functional Limitation: Mobility: Walking and moving around Mobility: Walking and Moving Around Current Status (N5621): At least 1 percent but less than 20 percent impaired, limited or restricted Mobility: Walking and Moving Around Goal Status [phone]): 0 percent impaired, limited or restricted Mobility: Walking and Moving Around Discharge Status [phone]): 0 percent impaired, limited or  restricted   [facility] 03/09/2013, 11:07 AM

## 2013-03-09 NOTE — Progress Notes (Signed)
Utilization Review completed.  

## 2013-03-09 NOTE — Progress Notes (Signed)
Subjective: 2 Days Post-Op Procedure(s) (LRB): HEMI-MICRODISCECTOMY LUMBAR LAMINECTOMY L4-L5 ON RIGHT (Right) Patient reports pain as better today. Still has residual headache but much better today.    Objective: Vital signs in last 24 hours: Temp:  [98 F (36.7 C)-100 F (37.8 C)] 98.9 F (37.2 C) (12/20 0639) Pulse Rate:  [60-79] 63 (12/20 0639) Resp:  [14-16] 14 (12/20 0639) BP: (122-131)/(66-75) 131/75 mmHg (12/20 0639) SpO2:  [94 %-100 %] 94 % (12/20 0639)  Intake/Output from previous day: 12/19 0701 - 12/20 0700 In: 2036.7 [P.O.:180; I.V.:1856.7] Out: 2775 [Urine:2775] Intake/Output this shift:     Recent Labs  03/06/13 1445  HGB 13.5    Recent Labs  03/06/13 1445  WBC 10.1  RBC 4.42  HCT 38.7*  PLT 271    Recent Labs  03/06/13 1445  NA 142  K 4.3  CL 106  CO2 28  BUN 19  CREATININE 0.95  GLUCOSE 99  CALCIUM 9.8    Recent Labs  03/06/13 1445  INR 1.03    Neurologically intact Neurovascular intact Dorsiflexion/Plantar flexion intact Dressing changed and incision clean wthout erythema or exudate.  Assessment/Plan: 2 Days Post-Op Procedure(s) (LRB): HEMI-MICRODISCECTOMY LUMBAR LAMINECTOMY L4-L5 ON RIGHT (Right) Discharge home with home health  Loanne Drilling 03/09/2013, 7:39 AM

## 2013-03-09 NOTE — Care Management Note (Signed)
Cm confirmed pt discharge plan with no HH services. MD order for 3N1. [facility] liaison Jermaine made aware.    Roxy Manns Cataleya Cristina,MSN,RN [phone]

## 2013-03-09 NOTE — Evaluation (Signed)
Occupational Therapy Evaluation Patient Details Name: Jeremiah Banks MRN: [id number] DOB: 1961/02/20 Today's Date: 03/09/2013 Time: 7829-5621 OT Time Calculation (min): 26 min  OT Assessment / Plan / Recommendation History of present illness pt admitted for L4-5 lami/hemi microdiscectomy   Clinical Impression   Pt was admitted for the above surgery.  He was not seen yesterday due to headache.  Eval completed today and all education was completed.  Pt does not need any further OT at this time.    OT Assessment  Patient does not need any further OT services    Follow Up Recommendations  No OT follow up    Barriers to Discharge      Equipment Recommendations  3 in 1 bedside comode    Recommendations for Other Services    Frequency       Precautions / Restrictions Precautions Precautions: Back Restrictions Weight Bearing Restrictions: No   Pertinent Vitals/Pain Some back pain present, not rated--repositioned in chair    ADL  Grooming: Teeth care;Set up Where Assessed - Grooming: Unsupported sitting Upper Body Bathing: Set up Where Assessed - Upper Body Bathing: Unsupported sitting Lower Body Bathing: Moderate assistance Where Assessed - Lower Body Bathing: Supported sit to stand Upper Body Dressing: Set up Where Assessed - Upper Body Dressing: Unsupported sitting Lower Body Dressing: Moderate assistance Where Assessed - Lower Body Dressing: Supported sit to Pharmacist, hospital: Minimal assistance Statistician Method: Sit to Barista: Bedside commode Toileting - Clothing Manipulation and Hygiene: Minimal assistance Where Assessed - Engineer, mining and Hygiene: Sit to stand from 3-in-1 or toilet Equipment Used: Rolling walker Transfers/Ambulation Related to ADLs: ambulated to bathroom without RW and used on way out.   ADL Comments: Worked through ADL and explained alternative methods for LB dressing.  Pt does not have AE--wife  will help him.    OT Diagnosis:    OT Problem List:   OT Treatment Interventions:     OT Goals(Current goals can be found in the care plan section)    Visit Information  Last OT Received On: 03/09/13 History of Present Illness: pt admitted for L4-5 lami/hemi microdiscectomy       Prior Functioning     Home Living Family/patient expects to be discharged to:: Private residence Living Arrangements: Spouse/significant other Available Help at Discharge: Family Type of Home: House Home Access: Level entry Home Layout: Two level;Bed/bath upstairs Prior Function Level of Independence: Independent Communication Communication: No difficulties Dominant Hand: Right         Vision/Perception     Cognition  Cognition Arousal/Alertness: Awake/alert Behavior During Therapy: WFL for tasks assessed/performed Overall Cognitive Status: Within Functional Limits for tasks assessed    Extremity/Trunk Assessment Upper Extremity Assessment Upper Extremity Assessment: Overall WFL for tasks assessed     Mobility Bed Mobility Bed Mobility: Rolling Left;Left Sidelying to Sit Rolling Left: 5: Supervision Left Sidelying to Sit: 5: Supervision Transfers Transfers: Sit to Stand Sit to Stand: 4: Min guard Details for Transfer Assistance: pt had been in bed since surgery on Thursday; min guard for safety     Exercise     Balance     End of Session OT - End of Session Activity Tolerance: Patient tolerated treatment well Patient left: in chair;with call bell/phone within reach  GO Functional Assessment Tool Used: clnical observation Functional Limitation: Self care Self Care Current Status (H0865): At least 40 percent but less than 60 percent impaired, limited or restricted Self Care Goal Status (H8469): At  least 40 percent but less than 60 percent impaired, limited or restricted Self Care Discharge Status [phone]): At least 40 percent but less than 60 percent impaired, limited or  restricted   [facility] 03/09/2013, 8:40 AM Marica Otter, OTR/L [phone] 03/09/2013

## 2013-03-10 NOTE — Discharge Summary (Signed)
Physician Discharge Summary   Patient ID: Jeremiah Banks MRN: [id number] DOB/AGE: 27-Jun-1960 52 y.o.  Admit date: 03/07/2013 Discharge date: 03/09/2013  Primary Diagnosis: Lumbar spinal stenosis and disc herniation  Admission Diagnoses:  Past Medical History  Diagnosis Date  . Acid reflux disease    Discharge Diagnoses:   Active Problems:   Spinal stenosis, lumbar region, with neurogenic claudication   Herniated lumbar intervertebral disc  Estimated body mass index is 24.8 kg/(m^2) as calculated from the following:   Height as of this encounter: 5\' 9"  (1.753 m).   Weight as of this encounter: 76.204 kg (168 lb).  Procedure:  Procedure(s) (LRB): HEMI-MICRODISCECTOMY LUMBAR LAMINECTOMY L4-L5 ON RIGHT (Right)   Consults: None  HPI: Jeremiah Banks presented to Dr. Grant Fontana office with the chief complaint of low back pain. He has a two month history of pain in his back radiating into the right lower limb. He did have significantly more pain over the month. He was seen at [facility] and was given oral prednisone. It worked very well for the pain while he was on it, but the pain just came back. He states he has had issues like this in the past, but certainly not as bad as this. They usually get better on its own. He has never had back surgery. He really denies any significant weakness. He denies any bowel or bladder incontinence. No fevers, chills or night sweats. No recent trauma. 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 He notes he can not stand for a long period of time. Symptoms are much worse in the morning. MRI showed lumbar disc herniation at L4-L5 on the right.     Laboratory Data: Admission on 03/07/2013, Discharged on 03/09/2013  Component Date Value Range Status  . Glucose-Capillary 03/07/2013 86  70 - 99 mg/dL Final  . Comment 1 40/98/1191 Documented in Chart   Final  . Comment 2 03/07/2013 Notify RN   Final  . Glucose-Capillary 03/08/2013 105* 70 - 99 mg/dL Final    Hospital Outpatient Visit on 03/06/2013  Component Date Value Range Status  . aPTT 03/06/2013 29  24 - 37 seconds Final  . Sodium 03/06/2013 142  135 - 145 mEq/L Final  . Potassium 03/06/2013 4.3  3.5 - 5.1 mEq/L Final  . Chloride 03/06/2013 106  96 - 112 mEq/L Final  . CO2 03/06/2013 28  19 - 32 mEq/L Final  . Glucose, Bld 03/06/2013 99  70 - 99 mg/dL Final  . BUN 47/82/9562 19  6 - 23 mg/dL Final  . Creatinine, Ser 03/06/2013 0.95  0.50 - 1.35 mg/dL Final  . Calcium 13/10/6576 9.8  8.4 - 10.5 mg/dL Final  . Total Protein 03/06/2013 7.0  6.0 - 8.3 g/dL Final  . Albumin 46/96/2952 4.2  3.5 - 5.2 g/dL Final  . AST 84/13/2440 24  0 - 37 U/L Final  . ALT 03/06/2013 52  0 - 53 U/L Final  . Alkaline Phosphatase 03/06/2013 64  39 - 117 U/L Final  . Total Bilirubin 03/06/2013 0.6  0.3 - 1.2 mg/dL Final  . GFR calc non Af Amer 03/06/2013 >90  >90 mL/min Final  . GFR calc Af Amer 03/06/2013 >90  >90 mL/min Final   Comment: (NOTE)                          The eGFR has been calculated using the CKD EPI equation.  This calculation has not been validated in all clinical situations.                          eGFR's persistently <90 mL/min signify possible Chronic Kidney                          Disease.  Marland Kitchen Prothrombin Time 03/06/2013 13.3  11.6 - 15.2 seconds Final  . INR 03/06/2013 1.03  0.00 - 1.49 Final  . Color, Urine 03/06/2013 YELLOW  YELLOW Final  . APPearance 03/06/2013 CLEAR  CLEAR Final  . Specific Gravity, Urine 03/06/2013 1.025  1.005 - 1.030 Final  . pH 03/06/2013 5.5  5.0 - 8.0 Final  . Glucose, UA 03/06/2013 NEGATIVE  NEGATIVE mg/dL Final  . Hgb urine dipstick 03/06/2013 NEGATIVE  NEGATIVE Final  . Bilirubin Urine 03/06/2013 NEGATIVE  NEGATIVE Final  . Ketones, ur 03/06/2013 NEGATIVE  NEGATIVE mg/dL Final  . Protein, ur 45/40/9811 NEGATIVE  NEGATIVE mg/dL Final  . Urobilinogen, UA 03/06/2013 0.2  0.0 - 1.0 mg/dL Final  . Nitrite 91/47/8295 NEGATIVE   NEGATIVE Final  . Leukocytes, UA 03/06/2013 NEGATIVE  NEGATIVE Final   MICROSCOPIC NOT DONE ON URINES WITH NEGATIVE PROTEIN, BLOOD, LEUKOCYTES, NITRITE, OR GLUCOSE <1000 mg/dL.  . WBC 03/06/2013 10.1  4.0 - 10.5 K/uL Final  . RBC 03/06/2013 4.42  4.22 - 5.81 MIL/uL Final  . Hemoglobin 03/06/2013 13.5  13.0 - 17.0 g/dL Final  . HCT 62/13/0865 38.7* 39.0 - 52.0 % Final  . MCV 03/06/2013 87.6  78.0 - 100.0 fL Final  . MCH 03/06/2013 30.5  26.0 - 34.0 pg Final  . MCHC 03/06/2013 34.9  30.0 - 36.0 g/dL Final  . RDW 78/46/9629 13.0  11.5 - 15.5 % Final  . Platelets 03/06/2013 271  150 - 400 K/uL Final  . MRSA, PCR 03/06/2013 NEGATIVE  NEGATIVE Final  . Staphylococcus aureus 03/06/2013 NEGATIVE  NEGATIVE Final   Comment:                                 The Xpert SA Assay (FDA                          approved for NASAL specimens                          in patients over 39 years of age),                          is one component of                          a comprehensive surveillance                          program.  Test performance has                          been validated by [employer] for patients greater  than or equal to 38 year old.                          It is not intended                          to diagnose infection nor to                          guide or monitor treatment.     X-Rays:Dg Chest 2 View  03/06/2013   CLINICAL DATA:  Preop  EXAM: CHEST  2 VIEW  COMPARISON:  None.  FINDINGS: The heart size and mediastinal contours are within normal limits. Both lungs are clear. The visualized skeletal structures are unremarkable.  IMPRESSION: No active cardiopulmonary disease.   Electronically Signed   By: Elige Ko   On: 03/06/2013 15:32   Dg Lumbar Spine 2-3 Views  03/06/2013   CLINICAL DATA:  Preop back surgery.  Please number lumbar spine  EXAM: LUMBAR SPINE - 2-3 VIEW  COMPARISON:  None  FINDINGS: Lumbar vertebra are  numbered.  Lowest disc space is L5-S1.  Normal alignment with straightening of the lumbar lordosis. Negative for fracture or mass.  Disc degeneration and spurring at L4-5 and L5-S1.  IMPRESSION: Disc degeneration and spurring L4-5 and L5-S1.   Electronically Signed   By: Marlan Palau M.D.   On: 03/06/2013 15:38   Dg Spine Portable 1 View  03/07/2013   CLINICAL DATA:  L4-5 disc herniation.  EXAM: PORTABLE SPINE - 1 VIEW  COMPARISON:  Prior today at 1710 hr  FINDINGS: Posterior retractors are seen in place, with the needle nail overlying the intervertebral disc space at L4-5.  IMPRESSION: Intraoperative localization of L4-5 disc space.   Electronically Signed   By: Myles Rosenthal M.D.   On: 03/07/2013 17:26   Dg Spine Portable 1 View  03/07/2013   CLINICAL DATA:  Lumbar surgery, disc herniation at L4-L5  EXAM: PORTABLE SPINE - 1 VIEW  COMPARISON:  Portable intraoperative exam 1710 hr compared to 1641 hr  FINDINGS: Five lumbar vertebrae labeled on prior exam, current exam labeled similarly.  Metallic probe via posterior approach with tip projecting dorsal to the inferior endplate of L4.  Tissue spreader projects dorsal to L4 and L5.  Mild disc space narrowing at L4-L5.  Atherosclerotic calcification aorta.  IMPRESSION: Intraoperative localization of the inferior L4 level.   Electronically Signed   By: Ulyses Southward M.D.   On: 03/07/2013 17:23   Dg Spine Portable 1 View  03/07/2013   CLINICAL DATA:  Surgical level L4-5  EXAM: PORTABLE SPINE - 1 VIEW  COMPARISON:  03/07/2013  FINDINGS: Surgical clamp on the spinous processes of L4 and L5.  Surgical instrument is directed over the lamina of L4 directed towards L4-5.  IMPRESSION: L4-5 localized.   Electronically Signed   By: Marlan Palau M.D.   On: 03/07/2013 16:33   Dg Spine Portable 1 View  03/07/2013   CLINICAL DATA:  A single lateral view of the lower lumbar spine is reviewed.  EXAM: PORTABLE SPINE - 1 VIEW  COMPARISON:  March 06, 2013  FINDINGS: The  metallic pins or needles projected over the spinous processes of L4 and L5. There is disc space narrowing at L4-L5. The visualized lumbar vertebral bodies are preserved in height.  IMPRESSION: The metallic hands overlie the posterior aspects of  the spinous processes of L4 and L5. Sign rib 4   Electronically Signed   By: David  [place]   On: 03/07/2013 16:17    EKG: Orders placed during the hospital encounter of 03/07/13  . EKG     Hospital Course: Kassim Guertin is a 52 y.o. who was admitted to [facility]. They were brought to the operating room on 03/07/2013 and underwent Procedure(s): HEMI-MICRODISCECTOMY LUMBAR LAMINECTOMY L4-L5 ON RIGHT.  Patient tolerated the procedure well and was later transferred to the recovery room and then to the orthopaedic floor for postoperative care.  They were given PO and IV analgesics for pain control following their surgery.  They were given 24 hours of postoperative antibiotics of  Anti-infectives   Start     Dose/Rate Route Frequency Ordered Stop   03/08/13 0600  ceFAZolin (ANCEF) IVPB 2 g/50 mL premix     2 g 100 mL/hr over 30 Minutes Intravenous On call to O.R. 03/07/13 1317 03/07/13 1550   03/07/13 2200  ceFAZolin (ANCEF) IVPB 1 g/50 mL premix     1 g 100 mL/hr over 30 Minutes Intravenous 3 times per day 03/07/13 1950 03/08/13 1436   03/07/13 1629  polymyxin B 500,000 Units, bacitracin 50,000 Units in sodium chloride irrigation 0.9 % 500 mL irrigation  Status:  Discontinued       As needed 03/07/13 1630 03/07/13 1805     and started on DVT prophylaxis in the form of Aspirin.   PT was ordered for gait training.  Discharge planning consulted to help with postop disposition and equipment needs.  Patient had a fair night on the evening of surgery.  They started to get up OOB with therapy on day one.  Continued to work with therapy into day two.  Dressing was changed on day two and the incision was clean and dry. Patient was seen in rounds and was ready  to go home.   Discharge Medications: Prior to Admission medications   Medication Sig Start Date End Date Taking? Authorizing Provider  ibuprofen (ADVIL,MOTRIN) 800 MG tablet Take 1 tablet (800 mg total) by mouth 3 (three) times daily as needed for headache. 03/08/13   Nicandro Perrault Tamala Ser, PA-C  methocarbamol (ROBAXIN) 500 MG tablet Take 1 tablet (500 mg total) by mouth every 6 (six) hours as needed for muscle spasms. 03/08/13   Vanesha Athens Tamala Ser, PA-C  oxyCODONE-acetaminophen (PERCOCET/ROXICET) 5-325 MG per tablet Take 1-2 tablets by mouth every 4 (four) hours as needed for moderate pain. 03/08/13   Jericho Alcorn Tamala Ser, PA-C    Diet: Regular diet Activity:WBAT Follow-up:in 2 weeks Disposition - Home Discharged Condition: stable   Discharge Orders   Future Orders Complete By Expires   Call MD / Call 911  As directed    Comments:     If you experience chest pain or shortness of breath, CALL 911 and be transported to the hospital emergency room.  If you develope a fever above 101 F, pus (white drainage) or increased drainage or redness at the wound, or calf pain, call your surgeon's office.   Constipation Prevention  As directed    Comments:     Drink plenty of fluids.  Prune juice may be helpful.  You may use a stool softener, such as Colace (over the counter) 100 mg twice a day.  Use MiraLax (over the counter) for constipation as needed.   Diet general  As directed    Discharge instructions  As directed    Comments:  You may shower starting Saturday For the first few days, remove your dressing, tape a piece of saran wrap over your incision, take your shower, then remove the saran wrap and put a clean dressing on. After two days you can shower without the saran wrap.  Change your dressing daily. Shower only, no tub bath. Call if any temperatures greater than 101 or any wound complications: [phone] during the day and ask for Dr. Jeannetta Ellis nurse, Mackey Birchwood.   Driving  restrictions  As directed    Comments:     No driving for 1 week or while on pain medication   Increase activity slowly as tolerated  As directed    Lifting restrictions  As directed    Comments:     No lifting       Medication List    STOP taking these medications       HYDROmorphone 4 MG tablet  Commonly known as:  DILAUDID      TAKE these medications       ibuprofen 800 MG tablet  Commonly known as:  ADVIL,MOTRIN  Take 1 tablet (800 mg total) by mouth 3 (three) times daily as needed for headache.     methocarbamol 500 MG tablet  Commonly known as:  ROBAXIN  Take 1 tablet (500 mg total) by mouth every 6 (six) hours as needed for muscle spasms.     oxyCODONE-acetaminophen 5-325 MG per tablet  Commonly known as:  PERCOCET/ROXICET  Take 1-2 tablets by mouth every 4 (four) hours as needed for moderate pain.           Follow-up Information   Follow up with GIOFFRE,RONALD A, MD. Schedule an appointment as soon as possible for a visit in 2 weeks.   Specialty:  Orthopedic Surgery   Contact information:   [address] [place] [address] [phone]       Signed: Kerby Nora 03/10/2013, 8:51 PM

## 2014-02-14 ENCOUNTER — Ambulatory Visit (INDEPENDENT_AMBULATORY_CARE_PROVIDER_SITE_OTHER): Payer: Self-pay | Admitting: Emergency Medicine

## 2014-02-14 VITALS — BP 128/84 | HR 53 | Temp 98.2°F | Resp 18 | Ht 69.0 in | Wt 185.0 lb

## 2014-02-14 DIAGNOSIS — Z021 Encounter for pre-employment examination: Secondary | ICD-10-CM

## 2014-02-14 NOTE — Progress Notes (Signed)
[facility] [address], [place] [address] [phone]  Date:  02/14/2014   Name:  Jeremiah BernheimMark Banks   DOB:  02/07/1961   MRN:  [id number]  PCP:  Marvis RepressSCHAEFFER,STANLEY, MD    Chief Complaint: Employment Physical   History of Present Illness:  Jeremiah Banks is a 53 y.o. very pleasant male patient who presents with the following:  DOT   Patient Active Problem List   Diagnosis Date Noted  . Spinal stenosis, lumbar region, with neurogenic claudication 03/07/2013  . Herniated lumbar intervertebral disc 03/07/2013    Past Medical History  Diagnosis Date  . Acid reflux disease     Past Surgical History  Procedure Laterality Date  . Nasal septum surgery    . Hemi-microdiscectomy lumbar laminectomy level 1 Right 03/07/2013    Procedure: HEMI-MICRODISCECTOMY LUMBAR LAMINECTOMY L4-L5 ON RIGHT;  Surgeon: Jacki Conesonald A Gioffre, MD;  Location: [facility]S;  Service: Orthopedics;  Laterality: Right;    History  Substance Use Topics  . Smoking status: Former Smoker    Types: Cigarettes    Quit date: 03/06/1985  . Smokeless tobacco: Never Used  . Alcohol Use: No    History reviewed. No pertinent family history.  No Known Allergies  Medication list has been reviewed and updated.  Current Outpatient Prescriptions on File Prior to Visit  Medication Sig Dispense Refill  . ibuprofen (ADVIL,MOTRIN) 800 MG tablet Take 1 tablet (800 mg total) by mouth 3 (three) times daily as needed for headache. 30 tablet 0  . methocarbamol (ROBAXIN) 500 MG tablet Take 1 tablet (500 mg total) by mouth every 6 (six) hours as needed for muscle spasms. 40 tablet 1  . oxyCODONE-acetaminophen (PERCOCET/ROXICET) 5-325 MG per tablet Take 1-2 tablets by mouth every 4 (four) hours as needed for moderate pain. (Patient not taking: Reported on 02/14/2014) 60 tablet 0   No current facility-administered medications on file prior to visit.    Review of Systems:  As per HPI, otherwise negative.     Physical Examination: Filed Vitals:   02/14/14 0835  BP: 128/84  Pulse: 53  Temp: 98.2 F (36.8 C)  Resp: 18   Filed Vitals:   02/14/14 0835  Height: 5\' 9"  (1.753 m)  Weight: 185 lb (83.915 kg)   Body mass index is 27.31 kg/(m^2). Ideal Body Weight: Weight in (lb) to have BMI = 25: 168.9  GEN: WDWN, NAD, Non-toxic, A & O x 3 HEENT: Atraumatic, Normocephalic. Neck supple. No masses, No LAD. Ears and Nose: No external deformity. CV: RRR, No M/G/R. No JVD. No thrill. No extra heart sounds. PULM: CTA B, no wheezes, crackles, rhonchi. No retractions. No resp. distress. No accessory muscle use. ABD: S, NT, ND, +BS. No rebound. No HSM. EXTR: No c/c/e NEURO Normal gait.  PSYCH: Normally interactive. Conversant. Not depressed or anxious appearing.  Calm demeanor.    Assessment and Plan: DOT  Signed,  Phillips OdorJeffery Westlee Devita, MD

## 2014-08-14 IMAGING — CR DG LUMBAR SPINE 2-3V
2 series · 2 of 2 positions shown · non-contrast
Comparison: None

CLINICAL DATA: Preop back surgery.  Please number lumbar spine

EXAM:
LUMBAR SPINE - 2-3 VIEW

[t l-spine a.p.]
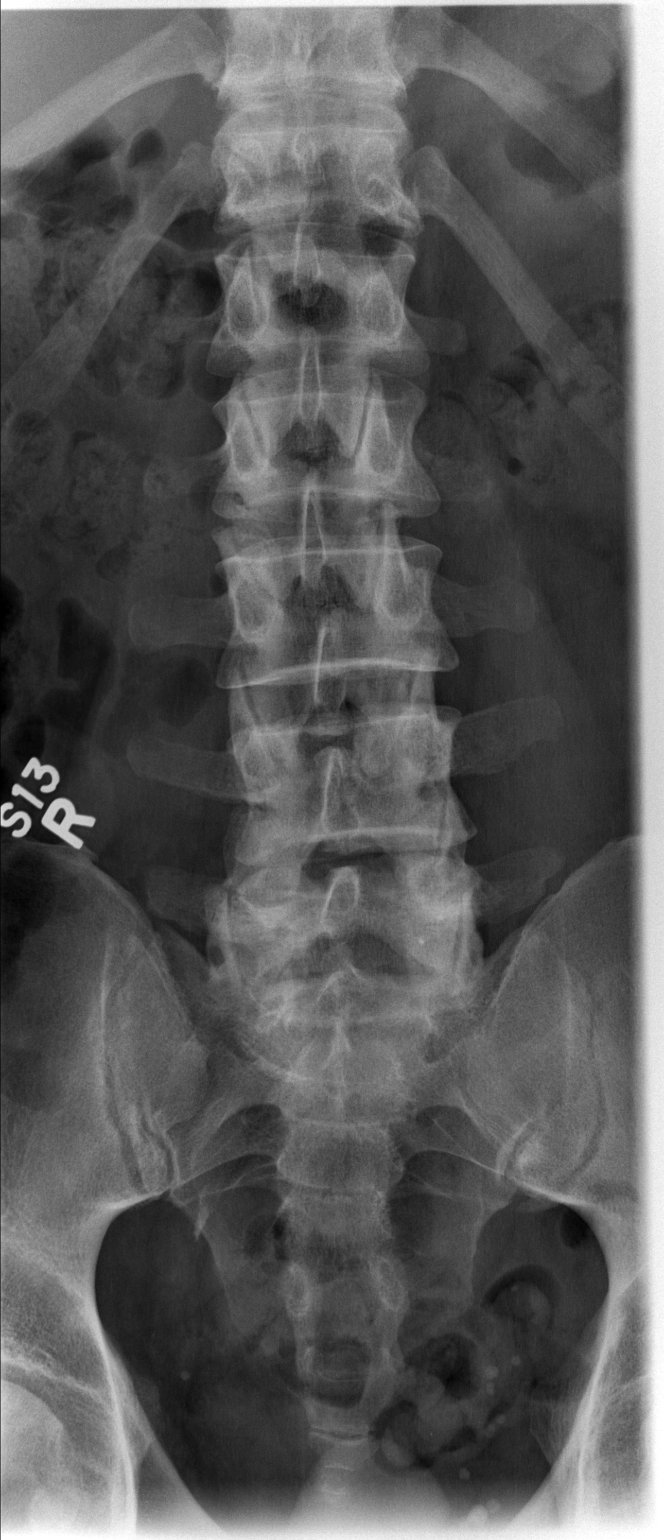

[t l-spine lat]
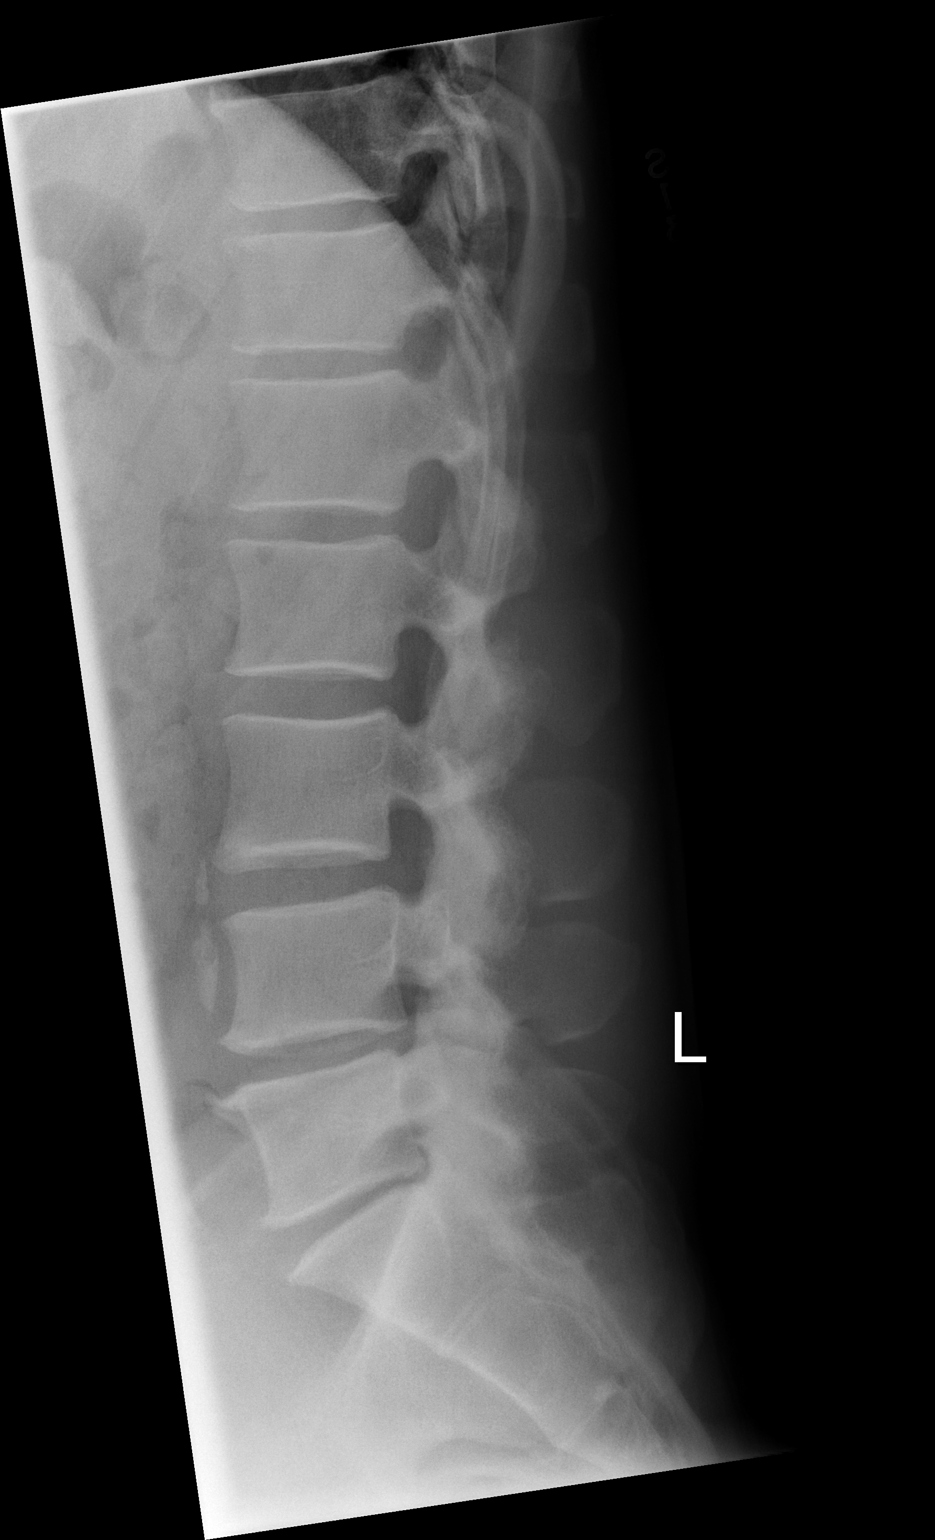

[2 of 2 positions shown; findings below may reference images not displayed]

FINDINGS: Lumbar vertebra are numbered.  Lowest disc space is L5-S1.

Normal alignment with straightening of the lumbar lordosis. Negative
for fracture or mass.

Disc degeneration and spurring at L4-5 and L5-S1.
IMPRESSION: Disc degeneration and spurring L4-5 and L5-S1.

## 2014-08-15 IMAGING — CR DG SPINE 1V PORT
1 series · 1 of 1 positions shown · non-contrast
Comparison: March 06, 2013

CLINICAL DATA: A single lateral view of the lower lumbar spine is
reviewed.

EXAM:
PORTABLE SPINE - 1 VIEW

[lateral]
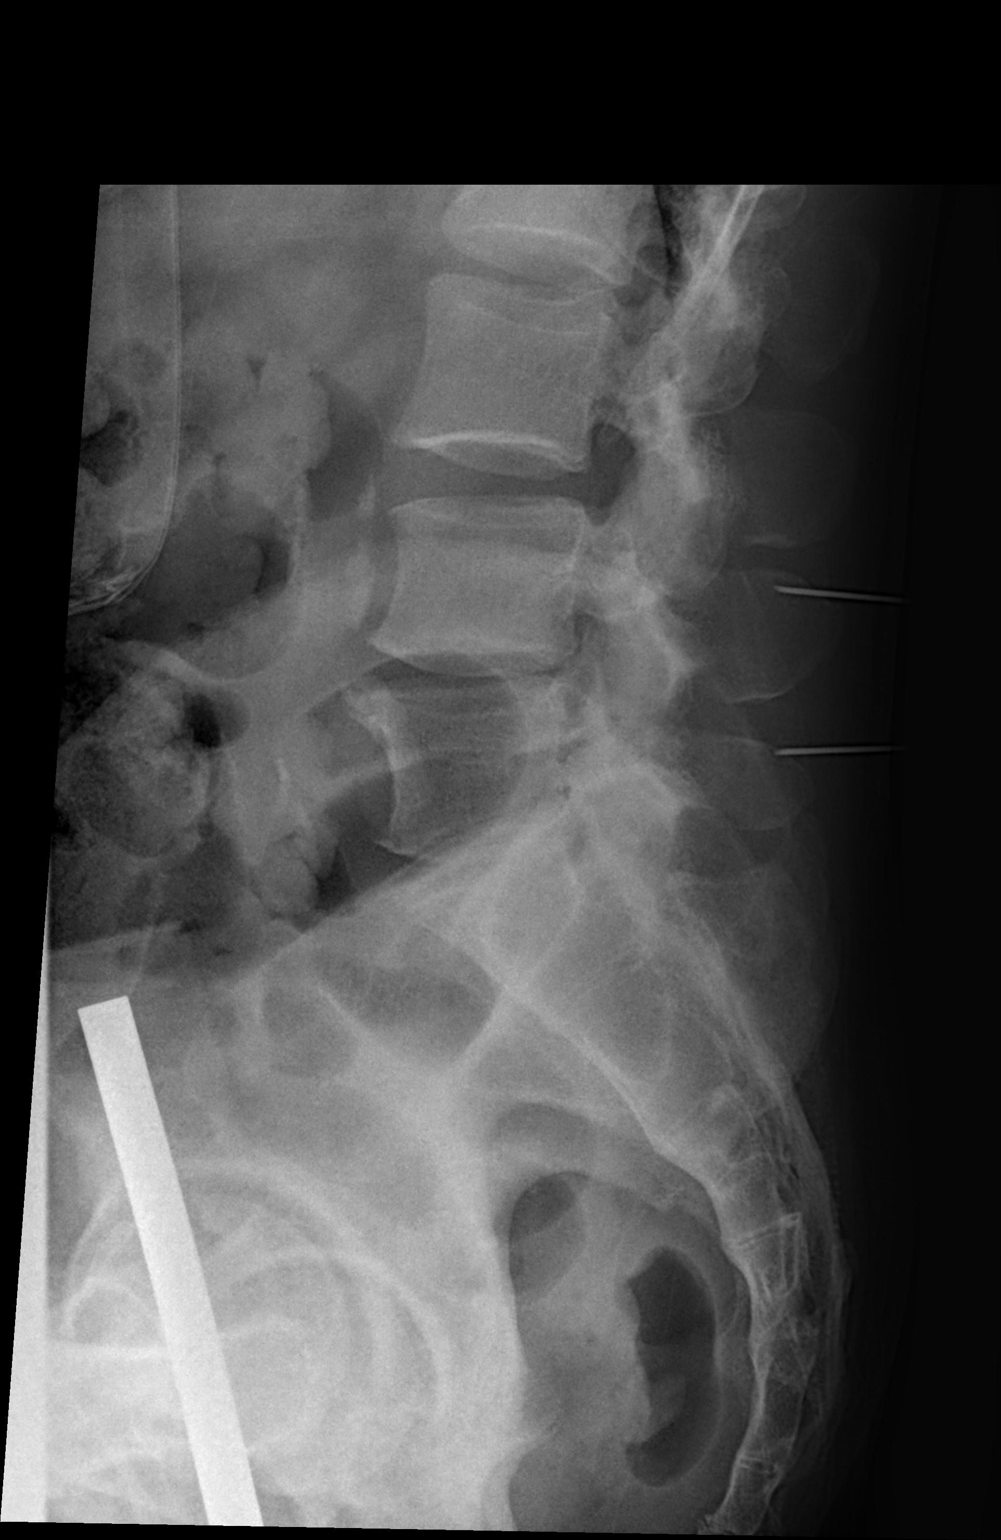

[1 of 1 positions shown; findings below may reference images not displayed]

FINDINGS: The metallic pins or needles projected over the spinous processes of
L4 and L5. There is disc space narrowing at L4-L5. The visualized
lumbar vertebral bodies are preserved in height.
IMPRESSION: The metallic hands overlie the posterior aspects of the spinous
processes of L4 and L5. Sign rib 4
